# Patient Record
Sex: Female | Born: 1990 | Race: White | Hispanic: No | Marital: Single | Smoking: Current every day smoker
Health system: Southern US, Community
[De-identification: ages and names within clinical notes are randomized; demographics above are authoritative.]

## PROBLEM LIST (undated history)

## (undated) DIAGNOSIS — L409 Psoriasis, unspecified: Secondary | ICD-10-CM

## (undated) DIAGNOSIS — B1920 Unspecified viral hepatitis C without hepatic coma: Secondary | ICD-10-CM

## (undated) DIAGNOSIS — M069 Rheumatoid arthritis, unspecified: Secondary | ICD-10-CM

## (undated) DIAGNOSIS — B977 Papillomavirus as the cause of diseases classified elsewhere: Secondary | ICD-10-CM

## (undated) DIAGNOSIS — J45909 Unspecified asthma, uncomplicated: Secondary | ICD-10-CM

## (undated) DIAGNOSIS — F419 Anxiety disorder, unspecified: Secondary | ICD-10-CM

## (undated) HISTORY — PX: HERNIA REPAIR: SHX51

## (undated) HISTORY — DX: Psoriasis, unspecified: L40.9

---

## 2016-02-01 ENCOUNTER — Encounter (HOSPITAL_COMMUNITY): Payer: Self-pay | Admitting: Emergency Medicine

## 2016-02-01 ENCOUNTER — Emergency Department (HOSPITAL_COMMUNITY)
Admission: EM | Admit: 2016-02-01 | Discharge: 2016-02-01 | Disposition: A | Discharge status: Home / Self Care | Payer: Self-pay | Attending: Emergency Medicine | Admitting: Emergency Medicine

## 2016-02-01 DIAGNOSIS — F17200 Nicotine dependence, unspecified, uncomplicated: Secondary | ICD-10-CM | POA: Insufficient documentation

## 2016-02-01 DIAGNOSIS — J45909 Unspecified asthma, uncomplicated: Secondary | ICD-10-CM | POA: Insufficient documentation

## 2016-02-01 DIAGNOSIS — M79674 Pain in right toe(s): Secondary | ICD-10-CM | POA: Insufficient documentation

## 2016-02-01 HISTORY — DX: Unspecified asthma, uncomplicated: J45.909

## 2016-02-01 HISTORY — DX: Anxiety disorder, unspecified: F41.9

## 2016-02-01 MED ORDER — PREDNISONE 20 MG PO TABS
60.0000 mg | ORAL_TABLET | Freq: Once | ORAL | Status: AC
  Administered 2016-02-01: 60 mg via ORAL
  Filled 2016-02-01: qty 3

## 2016-02-01 MED ORDER — PREDNISONE 50 MG PO TABS: ORAL_TABLET | ORAL | 0 refills | Status: DC

## 2016-02-01 NOTE — Discharge Instructions (Signed)
Rest, Ice intermittently (in the first 24-48 hours), Gentle compression with an Ace wrap, and elevate (Limb above the level of the heart)   Take acetaminophen (Tylenol) up to 975 mg (this is normally 3 over-the-counter pills) up to 3 times a day. Do not drink alcohol. Make sure your other medications do not contain acetaminophen (Read the labels!)

## 2016-02-01 NOTE — ED Notes (Signed)
Declined W/C at D/C and was escorted to lobby by RN. 

## 2016-02-01 NOTE — ED Triage Notes (Signed)
Pt sts 3rd digit toe pain on right foot from insect bite x 2 days

## 2016-02-01 NOTE — ED Provider Notes (Signed)
[facility] Provider Note   CSN: [id number] Arrival date & time: 02/01/16  1714   By signing my name below, I, Clovis Pu, attest that this documentation has been prepared under the direction and in the presence of  [employer], PA-C. Electronically Signed: Clovis Pu, ED Scribe. 02/01/16. 6:02 PM.  History   Chief Complaint Chief Complaint  Patient presents with  . Toe Pain    The history is provided by the patient. No language interpreter was used.   HPI Comments:  Nicole Knight is a 25 y.o. female who presents to the Emergency Department complaining of constant, moderate pain to the 3rd digit of her R foot which began 2 days ago. Pt notes the severity of the pain as being a "9/10" and states the pain is exacerbated with ambulation. She states the cause may have been a insect bite but denies witnessing the incident. She notes associated swelling to the area. She denies any other complaints at this time. Pt has been taking tylenol and benadryl with little relief.   Past Medical History:  Diagnosis Date  . Anxiety   . Asthma     There are no active problems to display for this patient.   History reviewed. No pertinent surgical history.  OB History    No data available       Home Medications    Prior to Admission medications   Medication Sig Start Date End Date Taking? Authorizing Provider  predniSONE (DELTASONE) 50 MG tablet Take 1 tablet daily with breakfast 02/01/16   Nicole Emery, PA-C    Family History History reviewed. No pertinent family history.  Social History Social History  Substance Use Topics  . Smoking status: Current Every Day Smoker  . Smokeless tobacco: Never Used  . Alcohol use No     Allergies   Ibuprofen and Zofran [ondansetron hcl]   Review of Systems Review of Systems 10 systems reviewed and all are negative for acute change except as noted in the HPI.    Physical Exam Updated Vital Signs BP 118/76 (BP  Location: Right Arm)   Pulse 69   Temp 98.5 F (36.9 C) (Oral)   Resp 18   SpO2 100%   Physical Exam  Constitutional: She is oriented to person, place, and time. She appears well-developed and well-nourished. No distress.  HENT:  Head: Normocephalic.  Eyes: Conjunctivae and EOM are normal.  Cardiovascular: Normal rate.   Pulmonary/Chest: Effort normal. No stridor.  Musculoskeletal: Normal range of motion. She exhibits edema and tenderness.  Right second digit with very mild edema and erythema to lateral aspect no puncture wound or warmth. No focal fluctuance.  Neurological: She is alert and oriented to person, place, and time.  Psychiatric: She has a normal mood and affect.  Nursing note and vitals reviewed.    ED Treatments / Results  DIAGNOSTIC STUDIES:  Oxygen Saturation is 100% on RA, normal by my interpretation.    COORDINATION OF CARE:  5:59 PM Discussed treatment plan with pt at bedside and pt agreed to plan.  Labs (all labs ordered are listed, but only abnormal results are displayed) Labs Reviewed - No data to display  EKG  EKG Interpretation None       Radiology No results found.  Procedures Procedures (including critical care time)  Medications Ordered in ED Medications  predniSONE (DELTASONE) tablet 60 mg (60 mg Oral Given 02/01/16 1830)     Initial Impression / Assessment and Plan / ED Course  I have reviewed the triage vital signs and the nursing notes.  Pertinent labs & imaging results that were available during my care of the patient were reviewed by me and considered in my medical decision making (see chart for details).  Clinical Course    Vitals:   02/01/16 1720  BP: 118/76  Pulse: 69  Resp: 18  Temp: 98.5 F (36.9 C)  TempSrc: Oral  SpO2: 100%    Medications  predniSONE (DELTASONE) tablet 60 mg (60 mg Oral Given 02/01/16 1830)    Nicole Knight is 25 y.o. female presenting with Pain and slight swelling to toe, likely  bit by a bug in the middle of the night. No significant warmth, very mild tenderness and edema. No signs of infection.  Evaluation does not show pathology that would require ongoing emergent intervention or inpatient treatment. Pt is hemodynamically stable and mentating appropriately. Discussed findings and plan with patient/guardian, who agrees with care plan. All questions answered. Return precautions discussed and outpatient follow up given.      Final Clinical Impressions(s) / ED Diagnoses   Final diagnoses:  Toe pain, right    New Prescriptions Discharge Medication List as of 02/01/2016  6:14 PM    START taking these medications   Details  predniSONE (DELTASONE) 50 MG tablet Take 1 tablet daily with breakfast, Print       I personally performed the services described in this documentation, which was scribed in my presence. The recorded information has been reviewed and is accurate.    Nicole Emeryicole Patriciann Becht, PA-C 02/01/16 1858    Nicole Reiningicole Tyliyah Mcmeekin, PA-C 02/01/16 1858    Cathren LaineKevin Steinl, MD 02/01/16 [phone]

## 2016-02-09 ENCOUNTER — Encounter (HOSPITAL_COMMUNITY): Payer: Self-pay | Admitting: Emergency Medicine

## 2016-02-09 ENCOUNTER — Emergency Department (HOSPITAL_COMMUNITY): Payer: Self-pay

## 2016-02-09 DIAGNOSIS — Y9389 Activity, other specified: Secondary | ICD-10-CM | POA: Insufficient documentation

## 2016-02-09 DIAGNOSIS — Z87891 Personal history of nicotine dependence: Secondary | ICD-10-CM | POA: Insufficient documentation

## 2016-02-09 DIAGNOSIS — S29011A Strain of muscle and tendon of front wall of thorax, initial encounter: Secondary | ICD-10-CM | POA: Insufficient documentation

## 2016-02-09 DIAGNOSIS — Y990 Civilian activity done for income or pay: Secondary | ICD-10-CM | POA: Insufficient documentation

## 2016-02-09 DIAGNOSIS — J45909 Unspecified asthma, uncomplicated: Secondary | ICD-10-CM | POA: Insufficient documentation

## 2016-02-09 DIAGNOSIS — X509XXA Other and unspecified overexertion or strenuous movements or postures, initial encounter: Secondary | ICD-10-CM | POA: Insufficient documentation

## 2016-02-09 DIAGNOSIS — Y9259 Other trade areas as the place of occurrence of the external cause: Secondary | ICD-10-CM | POA: Insufficient documentation

## 2016-02-09 DIAGNOSIS — S39011A Strain of muscle, fascia and tendon of abdomen, initial encounter: Secondary | ICD-10-CM | POA: Insufficient documentation

## 2016-02-09 LAB — BASIC METABOLIC PANEL
Anion gap: 9 (ref 5–15)
BUN: 17 mg/dL (ref 6–20)
CALCIUM: 9.2 mg/dL (ref 8.9–10.3)
CO2: 24 mmol/L (ref 22–32)
CREATININE: 0.87 mg/dL (ref 0.44–1.00)
Chloride: 108 mmol/L (ref 101–111)
GFR calc Af Amer: >60 mL/min (ref >60)
Glucose, Bld: 101 mg/dL — ABNORMAL HIGH (ref 65–99)
POTASSIUM: 3.2 mmol/L — AB (ref 3.5–5.1)
SODIUM: 141 mmol/L (ref 135–145)

## 2016-02-09 LAB — CBC
HEMATOCRIT: 40.7 % (ref 36.0–46.0)
Hemoglobin: 13.5 g/dL (ref 12.0–15.0)
MCH: 30 pg (ref 26.0–34.0)
MCHC: 33.2 g/dL (ref 30.0–36.0)
MCV: 90.4 fL (ref 78.0–100.0)
PLATELETS: 220 10*3/uL (ref 150–400)
RBC: 4.5 MIL/uL (ref 3.87–5.11)
RDW: 12.5 % (ref 11.5–15.5)
WBC: 9.6 10*3/uL (ref 4.0–10.5)

## 2016-02-09 LAB — I-STAT TROPONIN, ED: TROPONIN I, POC: 0 ng/mL (ref 0.00–0.08)

## 2016-02-09 NOTE — ED Triage Notes (Signed)
Endorses history of hepatitis C.

## 2016-02-09 NOTE — ED Triage Notes (Signed)
Patient arrives with complaint of right lower chest and RUQ abdominal pain. 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 States history of similar pain, but it usually resolves. Tonight the pain has lasted over 1 hours and has not improved. Appears very uncomfortable in triage. Taking deliberate breaths. Endorses exacerbation of pain with deep inspiration. Palpation of right posterior ribs exacerbates pain. History of Asthma.

## 2016-02-10 ENCOUNTER — Emergency Department (HOSPITAL_COMMUNITY)
Admission: EM | Admit: 2016-02-10 | Discharge: 2016-02-10 | Disposition: A | Discharge status: Home / Self Care | Payer: Self-pay | Attending: Emergency Medicine | Admitting: Emergency Medicine

## 2016-02-10 ENCOUNTER — Encounter (HOSPITAL_COMMUNITY): Payer: Self-pay | Admitting: *Deleted

## 2016-02-10 ENCOUNTER — Emergency Department (HOSPITAL_COMMUNITY): Payer: Self-pay

## 2016-02-10 DIAGNOSIS — T148XXA Other injury of unspecified body region, initial encounter: Secondary | ICD-10-CM

## 2016-02-10 HISTORY — DX: Unspecified viral hepatitis C without hepatic coma: B19.20

## 2016-02-10 LAB — HEPATIC FUNCTION PANEL
ALBUMIN: 4.2 g/dL (ref 3.5–5.0)
ALT: 19 U/L (ref 14–54)
AST: 22 U/L (ref 15–41)
Alkaline Phosphatase: 55 U/L (ref 38–126)
Bilirubin, Direct: <0.1 mg/dL — ABNORMAL LOW (ref 0.1–0.5)
TOTAL PROTEIN: 6.8 g/dL (ref 6.5–8.1)
Total Bilirubin: 0.4 mg/dL (ref 0.3–1.2)

## 2016-02-10 LAB — LIPASE, BLOOD: LIPASE: 24 U/L (ref 11–51)

## 2016-02-10 LAB — POC URINE PREG, ED: PREG TEST UR: NEGATIVE

## 2016-02-10 MED ORDER — METHOCARBAMOL 500 MG PO TABS: 500.0000 mg | ORAL_TABLET | Freq: Two times a day (BID) | ORAL | 0 refills | Status: DC

## 2016-02-10 NOTE — ED Notes (Signed)
Patient transported to Ultrasound 

## 2016-02-10 NOTE — ED Provider Notes (Signed)
[facility] Provider Note   CSN: [id number] Arrival date & time: 02/09/16  2019     History   Chief Complaint Chief Complaint  Patient presents with  . Shortness of Breath  . Chest Pain    HPI Nicole Knight is a 25 y.o. female.  Patient presents to the emergency department for evaluation of right-sided abdominal and chest pain. Patient reports that symptoms began tonight at work while Econom[employer]. Patient reports that she has had similar pain in the past, but usually it goes away fairly quickly. Tonight the pain has been persistent and more severe. Patient reports the pain is in the right upper abdomen but also the right side of the chest. She is feeling somewhat short of breath secondary to the pain. Pain worsens when she takes a deep breath.      Past Medical History:  Diagnosis Date  . Anxiety   . Asthma   . Hepatitis C     There are no active problems to display for this patient.   Past Surgical History:  Procedure Laterality Date  . HERNIA REPAIR      OB History    No data available       Home Medications    Prior to Admission medications   Medication Sig Start Date End Date Taking? Authorizing Provider  Adalimumab (HUMIRA) 40 MG/0.8ML PSKT Inject 40 mg into the skin every 21 ( twenty-one) days.   Yes Historical Provider, MD  albuterol (PROVENTIL HFA;VENTOLIN HFA) 108 (90 Base) MCG/ACT inhaler Inhale 1-2 puffs into the lungs every 6 (six) hours as needed for wheezing or shortness of breath.   Yes Historical Provider, MD  HYDROcodone-acetaminophen (NORCO) 10-325 MG tablet Take 1 tablet by mouth every 6 (six) hours as needed for moderate pain.   Yes Historical Provider, MD  medroxyPROGESTERone (DEPO-PROVERA) 150 MG/ML injection Inject 150 mg into the muscle every 3 (three) months.   Yes Historical Provider, MD  methocarbamol (ROBAXIN) 500 MG tablet Take 1 tablet (500 mg total) by mouth 2 (two) times daily. 02/10/16   Gilda Crease, MD      Family History History reviewed. No pertinent family history.  Social History Social History  Substance Use Topics  . Smoking status: Former Smoker    Quit date: 01/26/2016  . Smokeless tobacco: Never Used  . Alcohol use No     Allergies   Ibuprofen and Zofran [ondansetron hcl]   Review of Systems Review of Systems  Respiratory: Positive for shortness of breath.   Cardiovascular: Positive for chest pain.  Gastrointestinal: Positive for abdominal pain.  All other systems reviewed and are negative.    Physical Exam Updated Vital Signs BP 100/71   Pulse 86   Temp 98.2 F (36.8 C)   Resp 20   Ht 5' 2.25" (1.581 m)   Wt 134 lb 4 oz (60.9 kg)   SpO2 100%   BMI 24.36 kg/m   Physical Exam  Constitutional: She is oriented to person, place, and time. She appears well-developed and well-nourished. No distress.  HENT:  Head: Normocephalic and atraumatic.  Right Ear: Hearing normal.  Left Ear: Hearing normal.  Nose: Nose normal.  Mouth/Throat: Oropharynx is clear and moist and mucous membranes are normal.  Eyes: Conjunctivae and EOM are normal. Pupils are equal, round, and reactive to light.  Neck: Normal range of motion. Neck supple.  Cardiovascular: Regular rhythm, S1 normal and S2 normal.  Exam reveals no gallop and no friction rub.  No murmur heard. Pulmonary/Chest: Effort normal and breath sounds normal. No respiratory distress. She exhibits no tenderness.  Abdominal: Soft. Normal appearance and bowel sounds are normal. There is no hepatosplenomegaly. There is tenderness in the right upper quadrant. There is no rebound, no guarding, no tenderness at McBurney's point and negative Murphy's sign. No hernia.  Musculoskeletal: Normal range of motion.  Neurological: She is alert and oriented to person, place, and time. She has normal strength. No cranial nerve deficit or sensory deficit. Coordination normal. GCS eye subscore is 4. GCS verbal subscore is 5. GCS motor  subscore is 6.  Skin: Skin is warm, dry and intact. No rash noted. No cyanosis.  Psychiatric: She has a normal mood and affect. Her speech is normal and behavior is normal. Thought content normal.  Nursing note and vitals reviewed.    ED Treatments / Results  Labs (all labs ordered are listed, but only abnormal results are displayed) Labs Reviewed  BASIC METABOLIC PANEL - Abnormal; Notable for the following:       Result Value   Potassium 3.2 (*)    Glucose, Bld 101 (*)    All other components within normal limits  HEPATIC FUNCTION PANEL - Abnormal; Notable for the following:    Bilirubin, Direct <0.1 (*)    All other components within normal limits  CBC  LIPASE, BLOOD  I-STAT TROPOININ, ED  POC URINE PREG, ED    EKG  EKG Interpretation  Date/Time:  Monday February 09 2016 20:29:14 EDT Ventricular Rate:  86 PR Interval:  140 QRS Duration: 80 QT Interval:  374 QTC Calculation: 447 R Axis:   90 Text Interpretation:  Normal sinus rhythm Rightward axis Borderline ECG Confirmed by Blinda Leatherwood  MD, Zafiro Routson [phone]) on 02/10/2016 4:51:47 AM       Radiology Dg Chest 2 View  Result Date: 02/09/2016 CLINICAL DATA:  Right lower chest pain and shortness of breath today. EXAM: CHEST  2 VIEW COMPARISON:  None. FINDINGS: The cardiac silhouette, mediastinal and hilar contours are within normal limits given the pectus deformity. Prominent right infrahilar vessels is a typical finding. The lungs are clear. No pleural effusion. The bony thorax is intact. IMPRESSION: No acute cardiopulmonary findings. Moderate pectus deformity. Electronically Signed   By: Rudie Meyer M.D.   On: 02/09/2016 21:35   US Abdomen Limited Ruq  Result Date: 02/10/2016 CLINICAL DATA:  Right upper quadrant pain beginning earlier tonight after moving heavy luggage. History of hepatitis-C. EXAM: US ABDOMEN LIMITED - RIGHT UPPER QUADRANT COMPARISON:  None. FINDINGS: Gallbladder: No gallstones or wall thickening  visualized. No sonographic Murphy sign noted by sonographer. Common bile duct: Diameter: 2.8 mm, normal Liver: No focal lesion identified. Within normal limits in parenchymal echogenicity. IMPRESSION: Normal examination. Electronically Signed   By: Burman Nieves M.D.   On: 02/10/2016 05:25    Procedures Procedures (including critical care time)  Medications Ordered in ED Medications - No data to display   Initial Impression / Assessment and Plan / ED Course  I have reviewed the triage vital signs and the nursing notes.  Pertinent labs & imaging results that were available during my care of the patient were reviewed by me and considered in my medical decision making (see chart for details).  Clinical Course   Patient presents with complaints of pain on the right side of her abdomen and chest area. This occurred while pushing a heavy object. She has had this pain in the past as well, however. Patient had tenderness  in the right upper quadrant of her abdomen, as opposed to tenderness over the anterior rib field. X-ray of chest was unremarkable. Blood work was normal. Because of the abdominal tenderness, gallbladder disease was considered a possibility and ultrasound was performed. Ultrasound was normal. Symptoms likely explained by abdominal muscle strain.  Final Clinical Impressions(s) / ED Diagnoses   Final diagnoses:  Muscle strain    New Prescriptions New Prescriptions   METHOCARBAMOL (ROBAXIN) 500 MG TABLET    Take 1 tablet (500 mg total) by mouth 2 (two) times daily.     Gilda Creasehristopher J Cecilia Nishikawa, MD 02/10/16 [phone]

## 2016-02-19 ENCOUNTER — Telehealth: Payer: Self-pay | Admitting: *Deleted

## 2016-02-19 ENCOUNTER — Emergency Department (HOSPITAL_COMMUNITY): Payer: Self-pay

## 2016-02-19 ENCOUNTER — Encounter (HOSPITAL_COMMUNITY): Payer: Self-pay | Admitting: Emergency Medicine

## 2016-02-19 ENCOUNTER — Emergency Department (HOSPITAL_COMMUNITY)
Admission: EM | Admit: 2016-02-19 | Discharge: 2016-02-19 | Disposition: A | Discharge status: Home / Self Care | Payer: Self-pay | Attending: Emergency Medicine | Admitting: Emergency Medicine

## 2016-02-19 DIAGNOSIS — J069 Acute upper respiratory infection, unspecified: Secondary | ICD-10-CM | POA: Insufficient documentation

## 2016-02-19 DIAGNOSIS — J45909 Unspecified asthma, uncomplicated: Secondary | ICD-10-CM | POA: Insufficient documentation

## 2016-02-19 DIAGNOSIS — Z87891 Personal history of nicotine dependence: Secondary | ICD-10-CM | POA: Insufficient documentation

## 2016-02-19 HISTORY — DX: Rheumatoid arthritis, unspecified: M06.9

## 2016-02-19 MED ORDER — IPRATROPIUM-ALBUTEROL 0.5-2.5 (3) MG/3ML IN SOLN
3.0000 mL | Freq: Once | RESPIRATORY_TRACT | Status: AC
  Administered 2016-02-19: 3 mL via RESPIRATORY_TRACT
  Filled 2016-02-19: qty 3

## 2016-02-19 MED ORDER — AZITHROMYCIN 250 MG PO TABS: ORAL_TABLET | ORAL | 0 refills | Status: DC

## 2016-02-19 MED ORDER — HYDROCODONE-ACETAMINOPHEN 5-325 MG PO TABS: ORAL_TABLET | ORAL | 0 refills | Status: DC

## 2016-02-19 MED ORDER — ALBUTEROL SULFATE HFA 108 (90 BASE) MCG/ACT IN AERS
2.0000 | INHALATION_SPRAY | Freq: Once | RESPIRATORY_TRACT | Status: AC
  Administered 2016-02-19: 2 via RESPIRATORY_TRACT
  Filled 2016-02-19: qty 6.7

## 2016-02-19 NOTE — ED Provider Notes (Signed)
[facility] Provider Note   CSN: [id number] Arrival date & time: 02/19/16  4098  By signing my name below, I, Nicole Knight, attest that this documentation has been prepared under the direction and in the presence of non-physician practitioner, Wynetta Emery, PA-C. Electronically Signed: Freida Knight, Scribe. 02/19/2016. 5:15 PM.   History   Chief Complaint Chief Complaint  Patient presents with  . URI  . Cough  . Sore Throat    The history is provided by the patient. No language interpreter was used.    HPI Comments:  Nicole Knight is a 25 y.o. female with a history of asthmaAnd rheumatoid arthritis, who presents to the Emergency Department complaining of a productive cough with green sputum x ~  1 week. Pt reports associated mild SOB; notes she is out of her inhaler. She also notes rhinorrhea, congestion, and bilateral ear pain (L>R). She has taken Mucinex with mild relief.  She denies recent long periods of immobilizations. Pt is compliant with her Humira and has received her flu shot this season.   No PCP  Past Medical History:  Diagnosis Date  . Anxiety   . Asthma   . Hepatitis C   . RA (rheumatoid arthritis) (HCC)     There are no active problems to display for this patient.   Past Surgical History:  Procedure Laterality Date  . HERNIA REPAIR      OB History    No data available       Home Medications    Prior to Admission medications   Medication Sig Start Date End Date Taking? Authorizing Provider  Adalimumab (HUMIRA) 40 MG/0.8ML PSKT Inject 40 mg into the skin every 21 ( twenty-one) days.    Historical Provider, MD  albuterol (PROVENTIL HFA;VENTOLIN HFA) 108 (90 Base) MCG/ACT inhaler Inhale 1-2 puffs into the lungs every 6 (six) hours as needed for wheezing or shortness of breath.    Historical Provider, MD  azithromycin (ZITHROMAX Z-PAK) 250 MG tablet 2 po day one, then 1 daily x 4 days 02/19/16   Joni Reining Hajime Asfaw, PA-C    HYDROcodone-acetaminophen (NORCO/VICODIN) 5-325 MG tablet Take 1-2 tablets by mouth every 6 hours as needed for pain. 02/19/16   Seaver Machia, PA-C  medroxyPROGESTERone (DEPO-PROVERA) 150 MG/ML injection Inject 150 mg into the muscle every 3 (three) months.    Historical Provider, MD  methocarbamol (ROBAXIN) 500 MG tablet Take 1 tablet (500 mg total) by mouth 2 (two) times daily. 02/10/16   Gilda Crease, MD    Family History No family history on file.  Social History Social History  Substance Use Topics  . Smoking status: Former Smoker    Quit date: 01/26/2016  . Smokeless tobacco: Never Used  . Alcohol use No     Allergies   Ibuprofen and Zofran [ondansetron hcl]   Review of Systems Review of Systems  10 systems reviewed and all are negative for acute change except as noted in the HPI.  Physical Exam Updated Vital Signs BP (!) 114/54 (BP Location: Left Arm)   Pulse 62   Temp 98.2 F (36.8 C) (Oral)   Resp 16   SpO2 100%   Physical Exam  Constitutional: She appears well-developed and well-nourished.  HENT:  Head: Normocephalic.  Right Ear: External ear normal.  Left Ear: External ear normal.  Mouth/Throat: Oropharynx is clear and moist. No oropharyngeal exudate.  No drooling or stridor. Posterior pharynx mildly erythematous no significant tonsillar hypertrophy. No exudate. Soft palate rises symmetrically. No  TTP or induration under tongue.   No tenderness to palpation of frontal or bilateral maxillary sinuses.  Mild mucosal edema in the nares with scant rhinorrhea.  Bilateral tympanic membranes with normal architecture and good light reflex.    Eyes: Conjunctivae and EOM are normal. Pupils are equal, round, and reactive to light.  Neck: Normal range of motion. Neck supple.  Cardiovascular: Normal rate and regular rhythm.   Pulmonary/Chest: Effort normal and breath sounds normal. No stridor. No respiratory distress. She has no wheezes. She has no  rales. She exhibits no tenderness.  Abdominal: Soft. There is no tenderness. There is no rebound and no guarding.  Nursing note and vitals reviewed.    ED Treatments / Results  DIAGNOSTIC STUDIES:  Oxygen Saturation is 100% on RA, normal by my interpretation.    COORDINATION OF CARE:  10:11 AM Discussed treatment plan with pt at bedside and pt agreed to plan.  Labs (all labs ordered are listed, but only abnormal results are displayed) Labs Reviewed - No data to display  EKG  EKG Interpretation None       Radiology Dg Chest 2 View  Result Date: 02/19/2016 CLINICAL DATA:  Shortness of breath and cough for 1 week EXAM: CHEST  2 VIEW COMPARISON:  February 09, 2016 FINDINGS: There is a degree of pectus excavatum, stable. There is no apparent edema or consolidation. Heart size and pulmonary vascularity is within normal limits and stable. No adenopathy. No pneumothorax. IMPRESSION: No edema or consolidation. Stable pectus deformity. No appreciable change from recent prior study. Electronically Signed   By: Bretta Bang III M.D.   On: 02/19/2016 10:34    Procedures Procedures (including critical care time)  Medications Ordered in ED Medications  ipratropium-albuterol (DUONEB) 0.5-2.5 (3) MG/3ML nebulizer solution 3 mL (3 mLs Nebulization Given 02/19/16 1033)  albuterol (PROVENTIL HFA;VENTOLIN HFA) 108 (90 Base) MCG/ACT inhaler 2 puff (2 puffs Inhalation Given 02/19/16 1047)     Initial Impression / Assessment and Plan / ED Course  I have reviewed the triage vital signs and the nursing notes.  Pertinent labs & imaging results that were available during my care of the patient were reviewed by me and considered in my medical decision making (see chart for details).  Clinical Course    Vitals:   02/19/16 0947 02/19/16 1053  BP: 109/70 (!) 114/54  Pulse: 65 62  Resp:  16  Temp: 98.2 F (36.8 C)   TempSrc: Oral   SpO2: 100% 100%    Medications   ipratropium-albuterol (DUONEB) 0.5-2.5 (3) MG/3ML nebulizer solution 3 mL (3 mLs Nebulization Given 02/19/16 1033)  albuterol (PROVENTIL HFA;VENTOLIN HFA) 108 (90 Base) MCG/ACT inhaler 2 puff (2 puffs Inhalation Given 02/19/16 1047)    Nicole Knight is 25 y.o. female presenting with Reactive cough and shortness of breath. Lung sounds clear to auscultation, she saturating well on room air. Febrile and nontoxic-appearing. DuoNeb was given with improvement, states she's out of her home albuterol inhaler. Will start her on a Z-Pak for upper respiratory infection given her immunosuppression with Humira.  Evaluation does not show pathology that would require ongoing emergent intervention or inpatient treatment. Pt is hemodynamically stable and mentating appropriately. Discussed findings and plan with patient/guardian, who agrees with care plan. All questions answered. Return precautions discussed and outpatient follow up given.      Final Clinical Impressions(s) / ED Diagnoses   Final diagnoses:  URI (upper respiratory infection)    New Prescriptions Discharge Medication List as of  02/19/2016 10:55 AM    START taking these medications   Details  azithromycin (ZITHROMAX Z-PAK) 250 MG tablet 2 po day one, then 1 daily x 4 days, Print    HYDROcodone-acetaminophen (NORCO/VICODIN) 5-325 MG tablet Take 1-2 tablets by mouth every 6 hours as needed for pain., Print       I personally performed the services described in this documentation, which was scribed in my presence. The recorded information has been reviewed and is accurate.     Wynetta Emeryicole Laney Bagshaw, PA-C 02/19/16 1716    Lyndal Pulleyaniel Knott, MD 02/20/16 [id number]

## 2016-02-19 NOTE — Telephone Encounter (Signed)
Pharmacy called related to Rx: azithromycin (ZITHROMAX Z-PAK) 250 MG tablet .Marland Kitchen.Marland Kitchen.EDCM clarified with EDP to change Rx to: #6.

## 2016-02-19 NOTE — ED Notes (Signed)
Returned from xray

## 2016-02-19 NOTE — Discharge Instructions (Signed)

## 2016-02-26 ENCOUNTER — Encounter (HOSPITAL_COMMUNITY): Payer: Self-pay

## 2016-02-26 ENCOUNTER — Ambulatory Visit (HOSPITAL_COMMUNITY)
Admit: 2016-02-26 | Discharge: 2016-02-26 | Disposition: A | Discharge status: Home / Self Care | Payer: Self-pay | Attending: Obstetrics and Gynecology | Admitting: Obstetrics and Gynecology

## 2016-02-26 VITALS — BP 104/60 | Temp 98.4°F | Ht 62.5 in | Wt 133.8 lb

## 2016-02-26 DIAGNOSIS — Z1239 Encounter for other screening for malignant neoplasm of breast: Secondary | ICD-10-CM

## 2016-02-26 DIAGNOSIS — R87612 Low grade squamous intraepithelial lesion on cytologic smear of cervix (LGSIL): Secondary | ICD-10-CM

## 2016-02-26 NOTE — Patient Instructions (Signed)
Explained breast self awareness with Nicole Knight Nicole Knight. Explained the colposcopy the needed follow up for her abnormal Pap smear on 01/07/2016. [facility]. Appointment scheduled for Monday, March 08, 2016 at 0820. Patient aware of appointment and will be there. Informed patient she will need a screening mammogram at age [age] unless clinically indicated prior. Nicole Knight verbalized understanding.  Nicole Knight, Kathaleen Maserhristine Poll, RN 4:11 PM

## 2016-02-26 NOTE — Progress Notes (Signed)
[facility] due to having an abnormal Pap smear on 01/07/2016 that a colposcopy is recommended for follow up.  Pap Smear: Pap smear not completed today. [facility] with HPV effect present. [facility] for col[facility]. Appointment scheduled for Monday, March 08, 2016 at 0820. Patient has a history of two other abnormal Pap smears 11/28/2012 that was ASCUS with positive HPV and 01/07/2010 that was ASCUS. Repeat Pap smears were completed for follow up for the above two abnormal Pap smears. Last Pap smear result is in EPIC.  Physical exam: Breasts Breasts symmetrical. No skin abnormalities bilateral breasts. No nipple retraction bilateral breasts. No nipple discharge bilateral breasts. No lymphadenopathy. No lumps palpated bilateral breasts. No complaints of pain or tenderness on exam. Screening mammogram recommended at age 25 unless clinically indicated prior.   Pelvic/Bimanual No Pap smear completed today since last Pap smear was 01/07/2016. Pap smear not indicated per [facility]CP guidelines.   Smoking History: Patient has never smoked.  Patient Navigation: Patient education provided. [facility] to services [facility].

## 2016-03-01 ENCOUNTER — Encounter (HOSPITAL_COMMUNITY): Payer: Self-pay | Admitting: *Deleted

## 2016-03-08 ENCOUNTER — Other Ambulatory Visit (HOSPITAL_COMMUNITY)
Admission: RE | Admit: 2016-03-08 | Discharge: 2016-03-08 | Disposition: A | Discharge status: Home / Self Care | Payer: Self-pay | Source: Ambulatory Visit | Attending: Obstetrics & Gynecology | Admitting: Obstetrics & Gynecology

## 2016-03-08 ENCOUNTER — Encounter: Payer: Self-pay | Admitting: Family Medicine

## 2016-03-08 ENCOUNTER — Encounter: Payer: Self-pay | Admitting: Obstetrics & Gynecology

## 2016-03-08 ENCOUNTER — Ambulatory Visit (INDEPENDENT_AMBULATORY_CARE_PROVIDER_SITE_OTHER): Payer: Self-pay | Admitting: Obstetrics & Gynecology

## 2016-03-08 VITALS — BP 114/66 | HR 70 | Wt 133.1 lb

## 2016-03-08 DIAGNOSIS — Z113 Encounter for screening for infections with a predominantly sexual mode of transmission: Secondary | ICD-10-CM | POA: Insufficient documentation

## 2016-03-08 DIAGNOSIS — L409 Psoriasis, unspecified: Secondary | ICD-10-CM | POA: Insufficient documentation

## 2016-03-08 DIAGNOSIS — N898 Other specified noninflammatory disorders of vagina: Secondary | ICD-10-CM

## 2016-03-08 DIAGNOSIS — Z3202 Encounter for pregnancy test, result negative: Secondary | ICD-10-CM

## 2016-03-08 DIAGNOSIS — R87612 Low grade squamous intraepithelial lesion on cytologic smear of cervix (LGSIL): Secondary | ICD-10-CM

## 2016-03-08 LAB — POCT PREGNANCY, URINE: Preg Test, Ur: NEGATIVE

## 2016-03-08 NOTE — Addendum Note (Signed)
Addended by: Faythe CasaBELLAMY, Tremeka Helbling M on: 03/08/2016 11:35 AM   Modules accepted: Orders

## 2016-03-08 NOTE — Progress Notes (Signed)
Colposcopy Procedure Note  Indications: Pap smear 2 months ago showed: low-grade squamous intraepithelial neoplasia (LGSIL - encompassing HPV,mild dysplasia,CIN I). The prior pap showed ASCUS with POSITIVE high risk HPV.  Prior cervical/vaginal disease: normal exam without visible pathology. Prior cervical treatment: no treatment.  Procedure Details  The risks and benefits of the procedure and Written informed consent obtained.  Speculum placed in vagina and excellent visualization of cervix achieved, cervix swabbed x 3 with acetic acid solution.  Findings: Cervix: acetowhite lesion(s) noted at 11-2 o'clock; 7 & 9 o'clock cervix swabbed with Lugol's solution, SCJ visualized x 360 degrees, cervical biopsies taken at 12 & 9 o'clock, specimen labelled and sent to pathology and hemostasis achieved with Monsel's solution. Vaginal inspection: vaginal colposcopy not performed. Vulvar colposcopy: vulvar colposcopy not performed.  Specimens: ECC, and Biopsies at 12 & 9  Complications: none.  Plan: Specimens labelled and sent to Pathology. Will base further treatment on Pathology findings.

## 2016-03-08 NOTE — Patient Instructions (Signed)

## 2016-03-09 LAB — GC/CHLAMYDIA PROBE AMP (~~LOC~~) NOT AT ARMC
Chlamydia: NEGATIVE
Neisseria Gonorrhea: NEGATIVE

## 2016-03-09 LAB — WET PREP, GENITAL
Trich, Wet Prep: NONE SEEN
Yeast Wet Prep HPF POC: NONE SEEN

## 2016-03-10 ENCOUNTER — Encounter: Payer: Self-pay | Admitting: General Practice

## 2016-03-10 ENCOUNTER — Other Ambulatory Visit: Payer: Self-pay | Admitting: Obstetrics & Gynecology

## 2016-03-10 MED ORDER — METRONIDAZOLE 500 MG PO TABS: 500.0000 mg | ORAL_TABLET | Freq: Two times a day (BID) | ORAL | 0 refills | Status: DC

## 2016-03-11 MED ORDER — METRONIDAZOLE 500 MG PO TABS: 500.0000 mg | ORAL_TABLET | Freq: Two times a day (BID) | ORAL | 0 refills | Status: DC

## 2016-03-31 ENCOUNTER — Telehealth: Payer: Self-pay

## 2016-03-31 NOTE — Telephone Encounter (Signed)
Per Dr. Penne LashLeggett, pt biopsy CIN 1, no tx at this time, and pap in one year.  Notified pt of results.  Pt stated understanding with no further questions.

## 2016-04-15 ENCOUNTER — Emergency Department (HOSPITAL_COMMUNITY)
Admission: EM | Admit: 2016-04-15 | Discharge: 2016-04-15 | Disposition: A | Discharge status: Home / Self Care | Payer: Self-pay | Attending: Emergency Medicine | Admitting: Emergency Medicine

## 2016-04-15 ENCOUNTER — Emergency Department (HOSPITAL_COMMUNITY): Payer: Self-pay

## 2016-04-15 ENCOUNTER — Encounter (HOSPITAL_COMMUNITY): Payer: Self-pay | Admitting: Emergency Medicine

## 2016-04-15 DIAGNOSIS — R102 Pelvic and perineal pain: Secondary | ICD-10-CM

## 2016-04-15 DIAGNOSIS — F17210 Nicotine dependence, cigarettes, uncomplicated: Secondary | ICD-10-CM | POA: Insufficient documentation

## 2016-04-15 DIAGNOSIS — J45909 Unspecified asthma, uncomplicated: Secondary | ICD-10-CM | POA: Insufficient documentation

## 2016-04-15 DIAGNOSIS — N739 Female pelvic inflammatory disease, unspecified: Secondary | ICD-10-CM | POA: Insufficient documentation

## 2016-04-15 DIAGNOSIS — N730 Acute parametritis and pelvic cellulitis: Secondary | ICD-10-CM

## 2016-04-15 HISTORY — DX: Papillomavirus as the cause of diseases classified elsewhere: B97.7

## 2016-04-15 LAB — CBC
HCT: 41.2 % (ref 36.0–46.0)
Hemoglobin: 14.4 g/dL (ref 12.0–15.0)
MCH: 30.8 pg (ref 26.0–34.0)
MCHC: 35 g/dL (ref 30.0–36.0)
MCV: 88 fL (ref 78.0–100.0)
Platelets: 223 K/uL (ref 150–400)
RBC: 4.68 MIL/uL (ref 3.87–5.11)
RDW: 12.8 % (ref 11.5–15.5)
WBC: 10.8 K/uL — ABNORMAL HIGH (ref 4.0–10.5)

## 2016-04-15 LAB — WET PREP, GENITAL
Sperm: NONE SEEN
Trich, Wet Prep: NONE SEEN
Yeast Wet Prep HPF POC: NONE SEEN

## 2016-04-15 LAB — COMPREHENSIVE METABOLIC PANEL
ALBUMIN: 4.2 g/dL (ref 3.5–5.0)
ALT: 15 U/L (ref 14–54)
ANION GAP: 7 (ref 5–15)
AST: 19 U/L (ref 15–41)
Alkaline Phosphatase: 48 U/L (ref 38–126)
BILIRUBIN TOTAL: 0.5 mg/dL (ref 0.3–1.2)
BUN: 14 mg/dL (ref 6–20)
CO2: 24 mmol/L (ref 22–32)
Calcium: 9.3 mg/dL (ref 8.9–10.3)
Chloride: 106 mmol/L (ref 101–111)
Creatinine, Ser: 0.75 mg/dL (ref 0.44–1.00)
GFR calc Af Amer: >60 mL/min (ref >60)
Glucose, Bld: 96 mg/dL (ref 65–99)
POTASSIUM: 3.9 mmol/L (ref 3.5–5.1)
Sodium: 137 mmol/L (ref 135–145)
TOTAL PROTEIN: 6.6 g/dL (ref 6.5–8.1)

## 2016-04-15 LAB — URINALYSIS, ROUTINE W REFLEX MICROSCOPIC
Bilirubin Urine: NEGATIVE
Glucose, UA: NEGATIVE mg/dL
Hgb urine dipstick: NEGATIVE
Ketones, ur: NEGATIVE mg/dL
Leukocytes, UA: NEGATIVE
Nitrite: NEGATIVE
Protein, ur: NEGATIVE mg/dL
Specific Gravity, Urine: 1.021 (ref 1.005–1.030)
pH: 6 (ref 5.0–8.0)

## 2016-04-15 LAB — POC URINE PREG, ED: Preg Test, Ur: NEGATIVE

## 2016-04-15 LAB — LIPASE, BLOOD: Lipase: 29 U/L (ref 11–51)

## 2016-04-15 MED ORDER — AZITHROMYCIN 250 MG PO TABS
500.0000 mg | ORAL_TABLET | Freq: Once | ORAL | Status: AC
  Administered 2016-04-15: 500 mg via ORAL
  Filled 2016-04-15: qty 2

## 2016-04-15 MED ORDER — PROMETHAZINE HCL 25 MG/ML IJ SOLN
12.5000 mg | Freq: Once | INTRAMUSCULAR | Status: AC
  Administered 2016-04-15: 12.5 mg via INTRAVENOUS
  Filled 2016-04-15: qty 1

## 2016-04-15 MED ORDER — LIDOCAINE HCL (PF) 1 % IJ SOLN
INTRAMUSCULAR | Status: AC
  Administered 2016-04-15: 2 mL via INTRADERMAL
  Filled 2016-04-15: qty 5

## 2016-04-15 MED ORDER — SODIUM CHLORIDE 0.9 % IV BOLUS (SEPSIS)
1000.0000 mL | Freq: Once | INTRAVENOUS | Status: AC
  Administered 2016-04-15: 1000 mL via INTRAVENOUS

## 2016-04-15 MED ORDER — DOXYCYCLINE HYCLATE 100 MG PO CAPS: 100.0000 mg | ORAL_CAPSULE | Freq: Two times a day (BID) | ORAL | 0 refills | Status: AC

## 2016-04-15 MED ORDER — CEFTRIAXONE SODIUM 250 MG IJ SOLR
250.0000 mg | Freq: Once | INTRAMUSCULAR | Status: AC
  Administered 2016-04-15: 250 mg via INTRAMUSCULAR
  Filled 2016-04-15: qty 250

## 2016-04-15 MED ORDER — METRONIDAZOLE 500 MG PO TABS: 500.0000 mg | ORAL_TABLET | Freq: Three times a day (TID) | ORAL | 0 refills | Status: AC

## 2016-04-15 MED ORDER — HYDROCODONE-ACETAMINOPHEN 5-325 MG PO TABS
1.0000 | ORAL_TABLET | Freq: Once | ORAL | Status: AC
Start: 2016-04-15 — End: 2016-04-15
  Administered 2016-04-15: 1 via ORAL
  Filled 2016-04-15: qty 1

## 2016-04-15 MED ORDER — IOPAMIDOL (ISOVUE-300) INJECTION 61%
INTRAVENOUS | Status: AC
  Administered 2016-04-15: 100 mL
  Filled 2016-04-15: qty 100

## 2016-04-15 MED ORDER — FENTANYL CITRATE (PF) 100 MCG/2ML IJ SOLN
50.0000 ug | Freq: Once | INTRAMUSCULAR | Status: AC
  Administered 2016-04-15: 50 ug via INTRAVENOUS
  Filled 2016-04-15: qty 2

## 2016-04-15 MED ORDER — LIDOCAINE HCL (PF) 1 % IJ SOLN
2.0000 mL | Freq: Once | INTRAMUSCULAR | Status: AC
  Administered 2016-04-15: 2 mL via INTRADERMAL

## 2016-04-15 NOTE — ED Notes (Signed)
Pt had a cervical scrape three weeks ago. Pt has been reporting 10/10 pain for the last week with N/V with one occurrence of emesis today. Pt is afraid she has an infection.

## 2016-04-15 NOTE — ED Provider Notes (Signed)
[facility]EMER[facility]Y DEPT Provider Note   CSN: [id number] Arrival date & time: 04/15/16  0023   By signing my name below, I, Clovis PuAvnee Patel, attest that this documentation has been prepared under the direction and in the presence of Glynn OctaveStephen Hilmer Aliberti, MD  Electronically Signed: Clovis PuAvnee Patel, ED Scribe. 04/15/16. 2:35 AM.   History   Chief Complaint Chief Complaint  Patient presents with  . Abdominal Pain   The history is provided by the patient. No language interpreter was used.   HPI Comments:  Nicole Knight is a 25 y.o. female, with a hx of untreated hepatitis C, HPV and psoriasis treated with humera, who presents to the Emergency Department complaining of worsening, constant, moderate left and right abdominal pain near her pelvic region x 1 week. Her pain is worse upon palpation and when lifting items. She notes associated vaginal discharge. Pt has taken tylenol with little relief. Pt denies any hx of abdominal surgery, a hx of endometriosis, hx of ovarian cysts, any recent injury, change in appetite, vomiting, diarrhea, dysuria, hematuria, back pain, chest pain any other associated symptoms and modifying factors at this time. Pt is on birth control (depo shots) and denies the chance of being pregnant. She states she still has her ovaries, uterus, appendix and gall bladder. Pt does not have a PCP and note she goes to the health department. She notes a hx of IV drug use. She is allergic to iburpofen and Zofran.   Past Medical History:  Diagnosis Date  . Anxiety   . Asthma   . Hepatitis C   . HPV in female   . Psoriasis   . RA (rheumatoid arthritis) [facility](HCC)     Patient Active Problem List   Diagnosis Date Noted  . Psoriasis 03/08/2016  . Low grade squamous intraepithelial lesion (LGSIL) on cervical Pap smear 03/08/2016    Past Surgical History:  Procedure Laterality Date  . HERNIA REPAIR      OB History    Gravida Para Term Preterm AB Living   5       4 1    SAB TAB Ectopic  Multiple Live Births   4       1       Home Medications    Prior to Admission medications   Medication Sig Start Date End Date Taking? Authorizing Provider  Adalimumab (HUMIRA) 40 MG/0.8ML PSKT Inject 40 mg into the skin every 21 ( twenty-one) days.    Historical Provider, MD  albuterol (PROVENTIL HFA;VENTOLIN HFA) 108 (90 Base) MCG/ACT inhaler Inhale 1-2 puffs into the lungs every 6 (six) hours as needed for wheezing or shortness of breath.    Historical Provider, MD  azithromycin (ZITHROMAX Z-PAK) 250 MG tablet 2 po day one, then 1 daily x 4 days Patient not taking: Reported on 02/26/2016 02/19/16   Joni ReiningNicole Pisciotta, PA-C  HYDROcodone-acetaminophen (NORCO/VICODIN) 5-325 MG tablet Take 1-2 tablets by mouth every 6 hours as needed for pain. Patient not taking: Reported on 02/26/2016 02/19/16   Joni ReiningNicole Pisciotta, PA-C  medroxyPROGESTERone (DEPO-PROVERA) 150 MG/ML injection Inject 150 mg into the muscle every 3 (three) months.    Historical Provider, MD  methocarbamol (ROBAXIN) 500 MG tablet Take 1 tablet (500 mg total) by mouth 2 (two) times daily. Patient not taking: Reported on 02/26/2016 02/10/16   Gilda Creasehristopher J Pollina, MD  metroNIDAZOLE (FLAGYL) 500 MG tablet Take 1 tablet (500 mg total) by mouth 2 (two) times daily with a meal. 03/11/16   Lesly DukesKelly H Leggett, MD  Family History Family History  Problem Relation Age of Onset  . Hypertension Mother   . Hypertension Father   . Diabetes Paternal Grandfather     Social History Social History  Substance Use Topics  . Smoking status: Current Every Day Smoker    Packs/day: 1.00    Years: 10.00    Types: Cigarettes  . Smokeless tobacco: Never Used  . Alcohol use No     Allergies   Ibuprofen and Zofran [ondansetron hcl]   Review of Systems Review of Systems  Constitutional: Negative for appetite change.  Cardiovascular: Negative for chest pain.  Gastrointestinal: Positive for abdominal pain and nausea. Negative for diarrhea and  vomiting.  Genitourinary: Positive for vaginal discharge. Negative for dysuria and hematuria.  Musculoskeletal: Negative for back pain.   Physical Exam Updated Vital Signs BP 118/79 (BP Location: Left Arm)   Pulse 79   Temp 97.4 F (36.3 C) (Oral)   Resp 24   Ht 5\' 3"  (1.6 m)   Wt 131 lb 6.4 oz (59.6 kg)   SpO2 100%   BMI 23.28 kg/m   Physical Exam  Constitutional: She is oriented to person, place, and time. She appears well-developed and well-nourished. No distress.  Appears uncomfortable   HENT:  Head: Normocephalic and atraumatic.  Mouth/Throat: Oropharynx is clear and moist. No oropharyngeal exudate.  Eyes: Conjunctivae and EOM are normal. Pupils are equal, round, and reactive to light.  Neck: Normal range of motion. Neck supple.  No meningismus.   Cardiovascular: Normal rate, regular rhythm, normal heart sounds and intact distal pulses.   No murmur heard. Pulmonary/Chest: Effort normal and breath sounds normal. No respiratory distress.  Abdominal: Soft. There is no tenderness. There is no rebound and no guarding.  diffuse lower abdominal tenderness, worse suprapubic and LLQ.  Genitourinary:  Genitourinary Comments: Chaperone present  normal external genitalia. White discharge from cervix. Positive CMT. Left adnexal and midline tenderness. Less tenderness on right side.   Musculoskeletal: Normal range of motion. She exhibits no edema or tenderness.  No CVA tenderness  Neurological: She is alert and oriented to person, place, and time. No cranial nerve deficit. She exhibits normal muscle tone. Coordination normal.   5/5 strength throughout. CN 2-12 intact.Equal grip strength.   Skin: Skin is warm.  Psychiatric: She has a normal mood and affect. Her behavior is normal.  Nursing note and vitals reviewed.    ED Treatments / Results  DIAGNOSTIC STUDIES:  Oxygen Saturation is 100% on RA, normal by my interpretation.    COORDINATION OF CARE:  2:17 AM Discussed  treatment plan with pt at bedside and pt agreed to plan.  Labs (all labs ordered are listed, but only abnormal results are displayed) Labs Reviewed  WET PREP, GENITAL - Abnormal; Notable for the following:       Result Value   Clue Cells Wet Prep HPF POC PRESENT (*)    WBC, Wet Prep HPF POC TOO NUMEROUS TO COUNT (*)    All other components within normal limits  CBC - Abnormal; Notable for the following:    WBC 10.8 (*)    All other components within normal limits  LIPASE, BLOOD  COMPREHENSIVE METABOLIC PANEL  URINALYSIS, ROUTINE W REFLEX MICROSCOPIC (NOT AT [facility])  I-STAT BETA HCG BLOOD, ED ([facility], [facility], [facility] ONLY)  POC URINE PREG, ED  GC/CHLAMYDIA PROBE AMP ([place]) NOT AT [facility]    EKG  EKG Interpretation None       Radiology No results found.  Procedures Procedures (including critical care time)  Medications Ordered in ED Medications - No data to display   Initial Impression / Assessment and Plan / ED Course  I have reviewed the triage vital signs and the nursing notes.  Pertinent labs & imaging results that were available during my care of the patient were reviewed by me and considered in my medical decision making (see chart for details).  Clinical Course   Patient with pelvic pain worsening over the past week. Denies urinary or vaginal symptoms. No vomiting.  UA negative. HCG negative. Pelvic with concern for PID.  Will proceed with ultrasound.  Pelvic ultrasound shows no tubo-ovarian abscess or ovarian torsion. Patient treated for PID with Rocephin and Zithromax. Also be treated with Flagyl for bacterial vaginosis.  Patient well-appearing. no vomiting in the ED. Pain is well-controlled. Stable to follow up with her gynecologist. Return precautions discussed.  Final Clinical Impressions(s) / ED Diagnoses   Final diagnoses:  Pelvic pain  PID (acute pelvic inflammatory disease)    New Prescriptions New Prescriptions   No medications on file  I personally  performed the services described in this documentation, which was scribed in my presence. The recorded information has been reviewed and is accurate.    Glynn OctaveStephen Ezinne Yogi, MD 04/15/16 [phone]

## 2016-04-15 NOTE — Discharge Instructions (Signed)
Take the antibiotics as prescribed. Your sexual partner should be treated as well. Followup with your gynecologist. Return to the ED if you develop new or worsening symptoms.

## 2016-04-15 NOTE — ED Notes (Signed)
MD at bedside. 

## 2016-04-15 NOTE — ED Triage Notes (Signed)
Pt presents to the ED with abdominal pain that she describes located in the pelvic region on both sides. Pain started approximately a week ago but is the worst today. Pt has tried to take tylenol but no relief, walking makes the pain worse

## 2016-04-15 NOTE — ED Notes (Signed)
Patient transported to [facility] 

## 2016-04-16 LAB — GC/CHLAMYDIA PROBE AMP (~~LOC~~) NOT AT ARMC
Chlamydia: NEGATIVE
Neisseria Gonorrhea: NEGATIVE

## 2016-04-18 ENCOUNTER — Encounter (HOSPITAL_COMMUNITY): Payer: Self-pay | Admitting: Emergency Medicine

## 2016-04-18 ENCOUNTER — Emergency Department (HOSPITAL_COMMUNITY)
Admission: EM | Admit: 2016-04-18 | Discharge: 2016-04-18 | Disposition: A | Discharge status: Home / Self Care | Payer: Medicaid Other | Attending: Emergency Medicine | Admitting: Emergency Medicine

## 2016-04-18 DIAGNOSIS — E860 Dehydration: Secondary | ICD-10-CM | POA: Insufficient documentation

## 2016-04-18 DIAGNOSIS — N739 Female pelvic inflammatory disease, unspecified: Secondary | ICD-10-CM | POA: Insufficient documentation

## 2016-04-18 DIAGNOSIS — J45909 Unspecified asthma, uncomplicated: Secondary | ICD-10-CM | POA: Insufficient documentation

## 2016-04-18 DIAGNOSIS — F17210 Nicotine dependence, cigarettes, uncomplicated: Secondary | ICD-10-CM | POA: Insufficient documentation

## 2016-04-18 DIAGNOSIS — N730 Acute parametritis and pelvic cellulitis: Secondary | ICD-10-CM

## 2016-04-18 DIAGNOSIS — R197 Diarrhea, unspecified: Secondary | ICD-10-CM

## 2016-04-18 LAB — COMPREHENSIVE METABOLIC PANEL
ALBUMIN: 4.4 g/dL (ref 3.5–5.0)
ALT: 23 U/L (ref 14–54)
AST: 37 U/L (ref 15–41)
Alkaline Phosphatase: 47 U/L (ref 38–126)
Anion gap: 8 (ref 5–15)
BUN: 16 mg/dL (ref 6–20)
CALCIUM: 9 mg/dL (ref 8.9–10.3)
CHLORIDE: 108 mmol/L (ref 101–111)
CO2: 18 mmol/L — AB (ref 22–32)
Creatinine, Ser: 0.71 mg/dL (ref 0.44–1.00)
GFR calc Af Amer: >60 mL/min (ref >60)
GLUCOSE: 106 mg/dL — AB (ref 65–99)
POTASSIUM: 4.9 mmol/L (ref 3.5–5.1)
SODIUM: 134 mmol/L — AB (ref 135–145)
TOTAL PROTEIN: 6.6 g/dL (ref 6.5–8.1)
Total Bilirubin: 1.1 mg/dL (ref 0.3–1.2)

## 2016-04-18 LAB — CBC
HCT: 43.9 % (ref 36.0–46.0)
HEMOGLOBIN: 15.6 g/dL — AB (ref 12.0–15.0)
MCH: 31.1 pg (ref 26.0–34.0)
MCHC: 35.5 g/dL (ref 30.0–36.0)
MCV: 87.6 fL (ref 78.0–100.0)
Platelets: 224 10*3/uL (ref 150–400)
RBC: 5.01 MIL/uL (ref 3.87–5.11)
RDW: 12.8 % (ref 11.5–15.5)
WBC: 9.8 10*3/uL (ref 4.0–10.5)

## 2016-04-18 LAB — I-STAT BETA HCG BLOOD, ED (MC, WL, AP ONLY)

## 2016-04-18 LAB — WET PREP, GENITAL
CLUE CELLS WET PREP: NONE SEEN
Sperm: NONE SEEN
TRICH WET PREP: NONE SEEN
Yeast Wet Prep HPF POC: NONE SEEN

## 2016-04-18 LAB — URINALYSIS, ROUTINE W REFLEX MICROSCOPIC
BILIRUBIN URINE: NEGATIVE
Glucose, UA: NEGATIVE mg/dL
Hgb urine dipstick: NEGATIVE
Ketones, ur: 15 mg/dL — AB
NITRITE: NEGATIVE
Protein, ur: NEGATIVE mg/dL
SPECIFIC GRAVITY, URINE: 1.025 (ref 1.005–1.030)
pH: 5.5 (ref 5.0–8.0)

## 2016-04-18 LAB — URINE MICROSCOPIC-ADD ON: BACTERIA UA: NONE SEEN

## 2016-04-18 LAB — LIPASE, BLOOD: LIPASE: 27 U/L (ref 11–51)

## 2016-04-18 MED ORDER — METOCLOPRAMIDE HCL 5 MG/ML IJ SOLN
10.0000 mg | Freq: Once | INTRAMUSCULAR | Status: AC
  Administered 2016-04-18: 10 mg via INTRAVENOUS
  Filled 2016-04-18: qty 2

## 2016-04-18 MED ORDER — OXYCODONE-ACETAMINOPHEN 5-325 MG PO TABS
2.0000 | ORAL_TABLET | Freq: Once | ORAL | Status: AC
Start: 2016-04-18 — End: 2016-04-18
  Administered 2016-04-18: 2 via ORAL
  Filled 2016-04-18: qty 2

## 2016-04-18 MED ORDER — KETOROLAC TROMETHAMINE 60 MG/2ML IM SOLN
15.0000 mg | Freq: Once | INTRAMUSCULAR | Status: AC
  Administered 2016-04-18: 15 mg via INTRAMUSCULAR
  Filled 2016-04-18: qty 2

## 2016-04-18 MED ORDER — MORPHINE SULFATE (PF) 4 MG/ML IV SOLN
4.0000 mg | Freq: Once | INTRAVENOUS | Status: AC
Start: 2016-04-18 — End: 2016-04-18
  Administered 2016-04-18: 4 mg via INTRAVENOUS
  Filled 2016-04-18: qty 1

## 2016-04-18 MED ORDER — AZITHROMYCIN 250 MG PO TABS: 1000.0000 mg | ORAL_TABLET | Freq: Once | ORAL | 0 refills | Status: AC

## 2016-04-18 MED ORDER — PROMETHAZINE HCL 25 MG PO TABS: 25.0000 mg | ORAL_TABLET | Freq: Four times a day (QID) | ORAL | 0 refills | Status: AC | PRN

## 2016-04-18 MED ORDER — AZITHROMYCIN 250 MG PO TABS
1000.0000 mg | ORAL_TABLET | Freq: Once | ORAL | Status: AC
  Administered 2016-04-18: 1000 mg via ORAL
  Filled 2016-04-18: qty 4

## 2016-04-18 MED ORDER — HYDROCODONE-ACETAMINOPHEN 5-325 MG PO TABS: 1.0000 | ORAL_TABLET | Freq: Four times a day (QID) | ORAL | 0 refills | Status: AC | PRN

## 2016-04-18 MED ORDER — SODIUM CHLORIDE 0.9 % IV BOLUS (SEPSIS)
1000.0000 mL | Freq: Once | INTRAVENOUS | Status: AC
  Administered 2016-04-18: 1000 mL via INTRAVENOUS

## 2016-04-18 NOTE — ED Provider Notes (Signed)
[facility] Provider Note   CSN: [id number] Arrival date & time: 04/18/16  1403     History   Chief Complaint Chief Complaint  Patient presents with  . Allergic Reaction  . Emesis  . Diarrhea  . Pelvic Pain    HPI Nicole Knight is a 25 y.o. female.  HPI 25 year old female with past medical history of hepatitis C who presents with ongoing abdominal pain. The patient was just seen several days ago for lower abdominal pain. She underwent extensive workup at that time, including CT of the abdomen and pelvis as well as ultrasound, which was negative. She states that since then, she has begun to have perfuse, watery diarrhea and nonbloody, nonbilious emesis. She has also noticed persistent lower pelvic pain. Denies any vaginal discharge. Denies any fevers. She has bilateral her spinal pain but denies any CVA tenderness. She has no history of kidney stones. Denies known sick contacts.  Past Medical History:  Diagnosis Date  . Anxiety   . Asthma   . Hepatitis C   . HPV in female   . Psoriasis   . RA (rheumatoid arthritis) [facility](HCC)     Patient Active Problem List   Diagnosis Date Noted  . Psoriasis 03/08/2016  . Low grade squamous intraepithelial lesion (LGSIL) on cervical Pap smear 03/08/2016    Past Surgical History:  Procedure Laterality Date  . HERNIA REPAIR      OB History    Gravida Para Term Preterm AB Living   5       4 1    SAB TAB Ectopic Multiple Live Births   4       1       Home Medications    Prior to Admission medications   Medication Sig Start Date End Date Taking? Authorizing Provider  acetaminophen (TYLENOL) 325 MG tablet Take 650 mg by mouth every 6 (six) hours as needed for mild pain.    Historical Provider, MD  Adalimumab (HUMIRA) 40 MG/0.8ML PSKT Inject 40 mg into the skin every 21 ( twenty-one) days.    Historical Provider, MD  albuterol (PROVENTIL HFA;VENTOLIN HFA) 108 (90 Base) MCG/ACT inhaler Inhale 1-2 puffs into the lungs every  6 (six) hours as needed for wheezing or shortness of breath.    Historical Provider, MD  azithromycin (ZITHROMAX) 250 MG tablet Take 4 tablets (1,000 mg total) by mouth once. Take first 2 tablets together in one week, on 04/25/2016 04/25/16 04/25/16  Shaune Pollackameron Marquelle Balow, MD  doxycycline (VIBRAMYCIN) 100 MG capsule Take 1 capsule (100 mg total) by mouth 2 (two) times daily. 04/15/16   Glynn OctaveStephen Rancour, MD  HYDROcodone-acetaminophen (NORCO/VICODIN) 5-325 MG tablet Take 1-2 tablets by mouth every 6 (six) hours as needed for severe pain. 04/18/16   Shaune Pollackameron Cabria Micalizzi, MD  medroxyPROGESTERone (DEPO-PROVERA) 150 MG/ML injection Inject 150 mg into the muscle every 3 (three) months.    Historical Provider, MD  metroNIDAZOLE (FLAGYL) 500 MG tablet Take 1 tablet (500 mg total) by mouth 3 (three) times daily. 04/15/16   Glynn OctaveStephen Rancour, MD  promethazine (PHENERGAN) 25 MG tablet Take 1 tablet (25 mg total) by mouth every 6 (six) hours as needed for nausea or vomiting. 04/18/16   Shaune Pollackameron Alayiah Fontes, MD    Family History Family History  Problem Relation Age of Onset  . Hypertension Mother   . Hypertension Father   . Diabetes Paternal Grandfather     Social History Social History  Substance Use Topics  . Smoking status: Current Every Day Smoker  Packs/day: 1.00    Years: 10.00    Types: Cigarettes  . Smokeless tobacco: Never Used  . Alcohol use No     Allergies   Ibuprofen and Zofran [ondansetron hcl]   Review of Systems Review of Systems  Constitutional: Negative for chills, fatigue and fever.  HENT: Negative for congestion and rhinorrhea.   Eyes: Negative for visual disturbance.  Respiratory: Negative for cough, shortness of breath and wheezing.   Cardiovascular: Negative for chest pain and leg swelling.  Gastrointestinal: Positive for diarrhea and nausea. Negative for abdominal pain and vomiting.  Genitourinary: Positive for dysuria, pelvic pain and vaginal discharge. Negative for flank pain.    Musculoskeletal: Negative for neck pain and neck stiffness.  Skin: Negative for rash and wound.  Allergic/Immunologic: Negative for immunocompromised state.  Neurological: Negative for syncope, weakness and headaches.  All other systems reviewed and are negative.    Physical Exam Updated Vital Signs BP 107/60 (BP Location: Right Arm)   Pulse 64   Temp 98.1 F (36.7 C) (Oral)   Resp 16   Ht 5' 2.5" (1.588 m)   Wt 131 lb (59.4 kg)   SpO2 100%   BMI 23.58 kg/m   Physical Exam  Constitutional: She is oriented to person, place, and time. She appears well-developed and well-nourished. No distress.  HENT:  Head: Normocephalic and atraumatic.  Mouth/Throat: Oropharynx is clear and moist. No oropharyngeal exudate.  Eyes: Conjunctivae are normal.  Neck: Neck supple.  Cardiovascular: Normal rate, regular rhythm and normal heart sounds.  Exam reveals no friction rub.   No murmur heard. Pulmonary/Chest: Effort normal and breath sounds normal. No respiratory distress. She has no wheezes. She has no rales.  Abdominal: Soft. Bowel sounds are normal. She exhibits no distension. There is tenderness (Mild, suprapubic). There is no rebound and no guarding.  Genitourinary:  Genitourinary Comments: Moderate volume yellow white vaginal discharge. Marked cervical motion tenderness. No adnexal fullness or tenderness.  Musculoskeletal: She exhibits no edema.  Neurological: She is alert and oriented to person, place, and time. She exhibits normal muscle tone.  Skin: Skin is warm. Capillary refill takes less than 2 seconds.  Psychiatric: She has a normal mood and affect.  Nursing note and vitals reviewed.    ED Treatments / Results  Labs (all labs ordered are listed, but only abnormal results are displayed) Labs Reviewed  WET PREP, GENITAL - Abnormal; Notable for the following:       Result Value   WBC, Wet Prep HPF POC MANY (*)    All other components within normal limits  COMPREHENSIVE  METABOLIC PANEL - Abnormal; Notable for the following:    Sodium 134 (*)    CO2 18 (*)    Glucose, Bld 106 (*)    All other components within normal limits  CBC - Abnormal; Notable for the following:    Hemoglobin 15.6 (*)    All other components within normal limits  URINALYSIS, ROUTINE W REFLEX MICROSCOPIC (NOT AT [facility]) - Abnormal; Notable for the following:    Color, Urine AMBER (*)    Ketones, ur 15 (*)    Leukocytes, UA SMALL (*)    All other components within normal limits  URINE MICROSCOPIC-ADD ON - Abnormal; Notable for the following:    Squamous Epithelial / LPF 0-5 (*)    All other components within normal limits  LIPASE, BLOOD  I-STAT BETA HCG BLOOD, ED ([facility], [facility], [facility] ONLY)  GC/CHLAMYDIA PROBE AMP ([place]) NOT AT [facility]  EKG  EKG Interpretation None       Radiology No results found.  Procedures Procedures (including critical care time)  Medications Ordered in ED Medications  sodium chloride 0.9 % bolus 1,000 mL (0 mLs Intravenous Stopped 04/18/16 1800)  morphine 4 MG/ML injection 4 mg (4 mg Intravenous Given 04/18/16 1627)  ketorolac (TORADOL) injection 15 mg (15 mg Intramuscular Given 04/18/16 1627)  metoCLOPramide (REGLAN) injection 10 mg (10 mg Intravenous Given 04/18/16 1628)  azithromycin (ZITHROMAX) tablet 1,000 mg (1,000 mg Oral Given 04/18/16 1800)  oxyCODONE-acetaminophen (PERCOCET/ROXICET) 5-325 MG per tablet 2 tablet (2 tablets Oral Given 04/18/16 1800)     Initial Impression / Assessment and Plan / ED Course  I have reviewed the triage vital signs and the nursing notes.  Pertinent labs & imaging results that were available during my care of the patient were reviewed by me and considered in my medical decision making (see chart for details).  Clinical Course     25 yo F with PMHx as above who p/w persistent pelvic pain, nausea, diarrhea after recent diagnosis of PID. Tolerating PO. On arrival, VSS and WNL. Labs show mild dehydration but o/w  no leukocytosis, no AG or evidence of sepsis or lactic acidosis. Exam is as above. Suspect pt has ongoing PID with subsequent nausea. LFTs are normal without evidence of FHC. Regarding her diarrhea, DDx includes primary GI illness as etiology for her sx versus ABX-induced diarrhea. D/w pharmacy, will switch to azithro 1g PO once weekly x2 doses. Otherwise, IVF given and pt markedly improved. She is tolerating PO. Given recent negative imaging and extensive w/u, do not feel repeat imaging indicated. D/c with return precautions.  Final Clinical Impressions(s) / ED Diagnoses   Final diagnoses:  Dehydration  PID (acute pelvic inflammatory disease)  Diarrhea, unspecified type    New Prescriptions Discharge Medication List as of 04/18/2016  6:15 PM    START taking these medications   Details  azithromycin (ZITHROMAX) 250 MG tablet Take 4 tablets (1,000 mg total) by mouth once. Take first 2 tablets together in one week, on 04/25/2016, Starting Sun 04/25/2016, Print    HYDROcodone-acetaminophen (NORCO/VICODIN) 5-325 MG tablet Take 1-2 tablets by mouth every 6 (six) hours as needed for severe pain., Starting Sun 04/18/2016, Print    promethazine (PHENERGAN) 25 MG tablet Take 1 tablet (25 mg total) by mouth every 6 (six) hours as needed for nausea or vomiting., Starting Sun 04/18/2016, Print         Shaune Pollack, MD 04/19/16 1723

## 2016-04-18 NOTE — ED Triage Notes (Signed)
Pt reports that she may be having an allergic reaction to antibiotics she is currently taking for PID. Pt reports vomiting and diarrhea, pelvic pain and pain to left low back.

## 2016-04-21 LAB — GC/CHLAMYDIA PROBE AMP (~~LOC~~) NOT AT ARMC
Chlamydia: NEGATIVE
NEISSERIA GONORRHEA: NEGATIVE

## 2017-12-30 ENCOUNTER — Telehealth (HOSPITAL_COMMUNITY): Payer: Self-pay

## 2017-12-30 NOTE — Telephone Encounter (Signed)
Tried to call number on file but the person said I had the wrong number.

## 2018-05-22 IMAGING — CR DG CHEST 2V
2 series · 2 of 2 positions shown · non-contrast
Comparison: None.

CLINICAL DATA: Right lower chest pain and shortness of breath
today.

EXAM:
CHEST  2 VIEW

[chest pa]
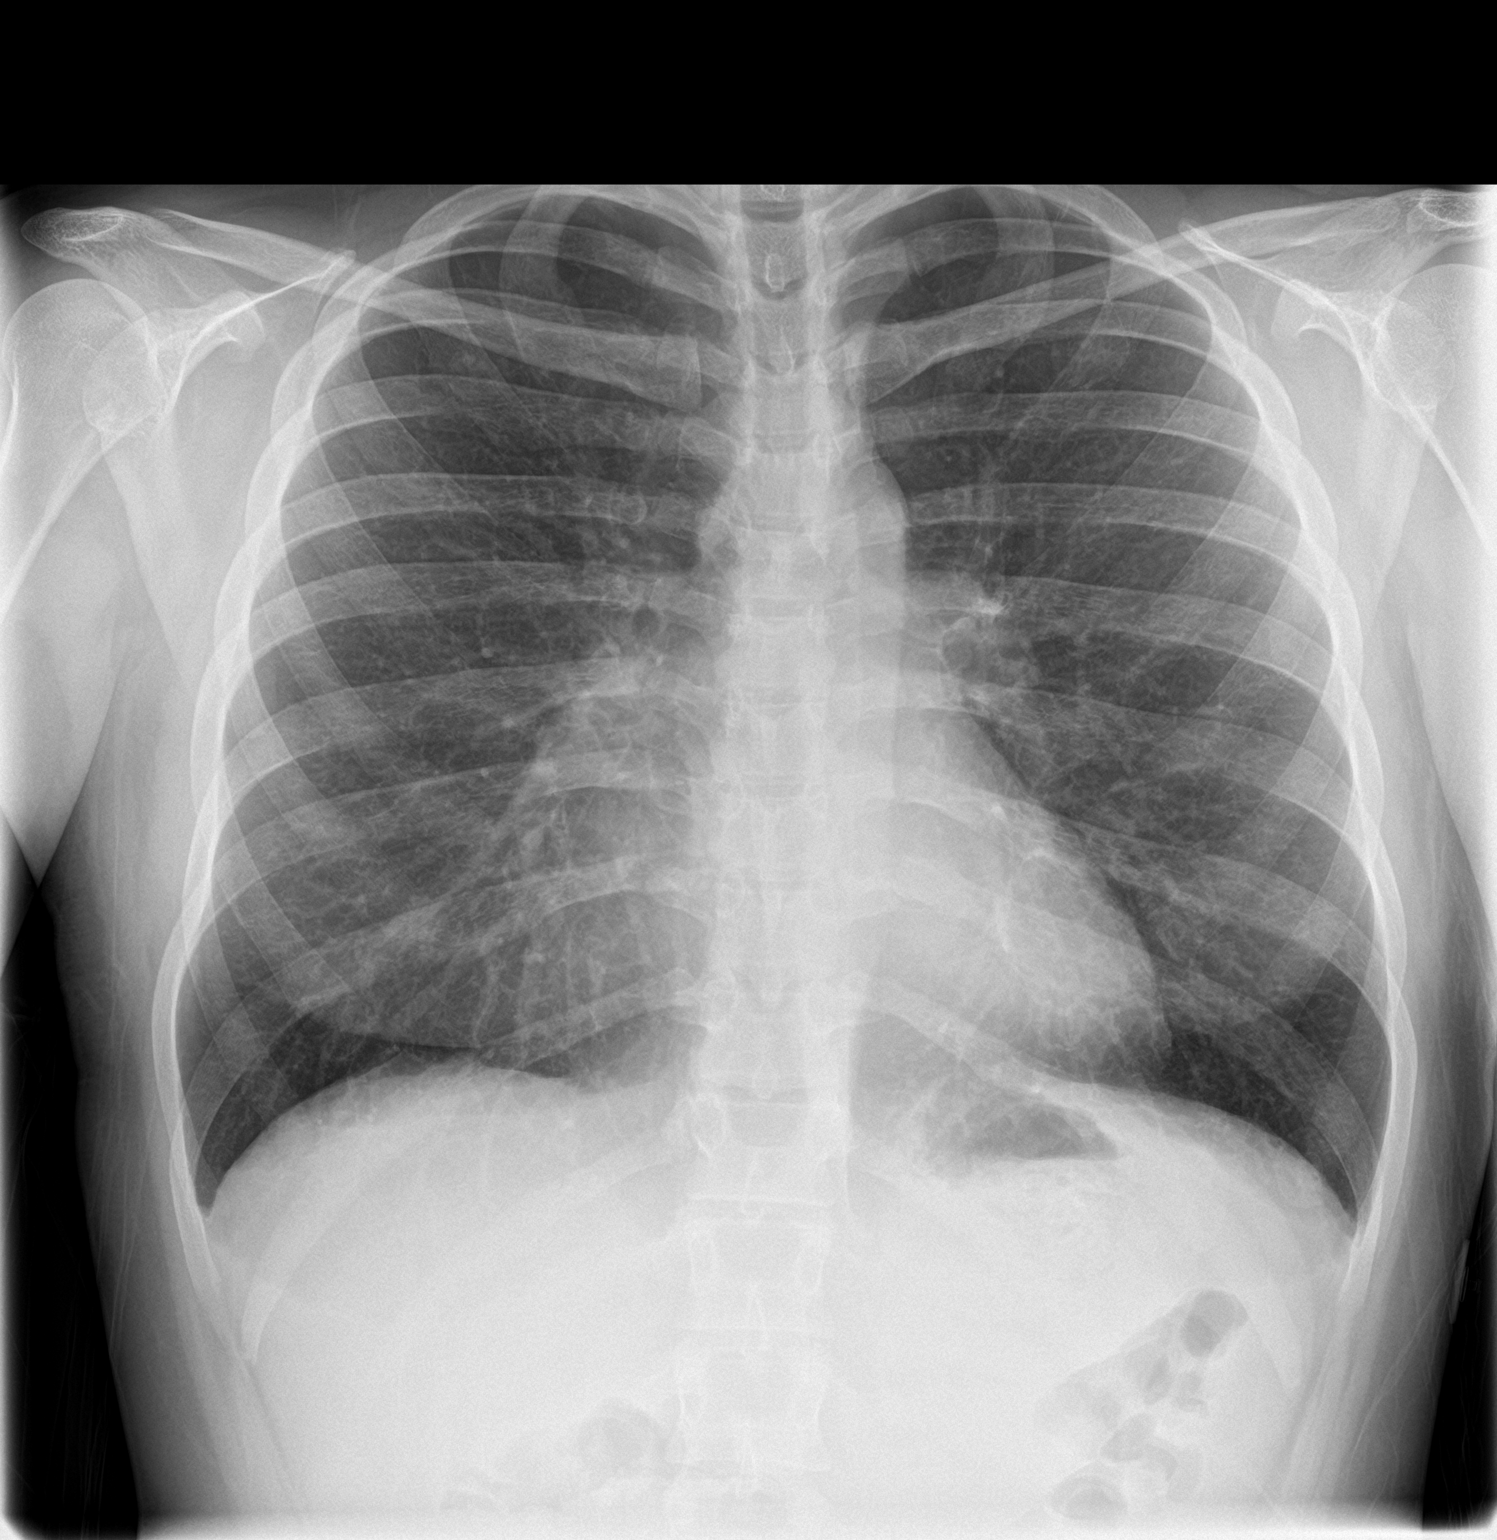

[chest lat]
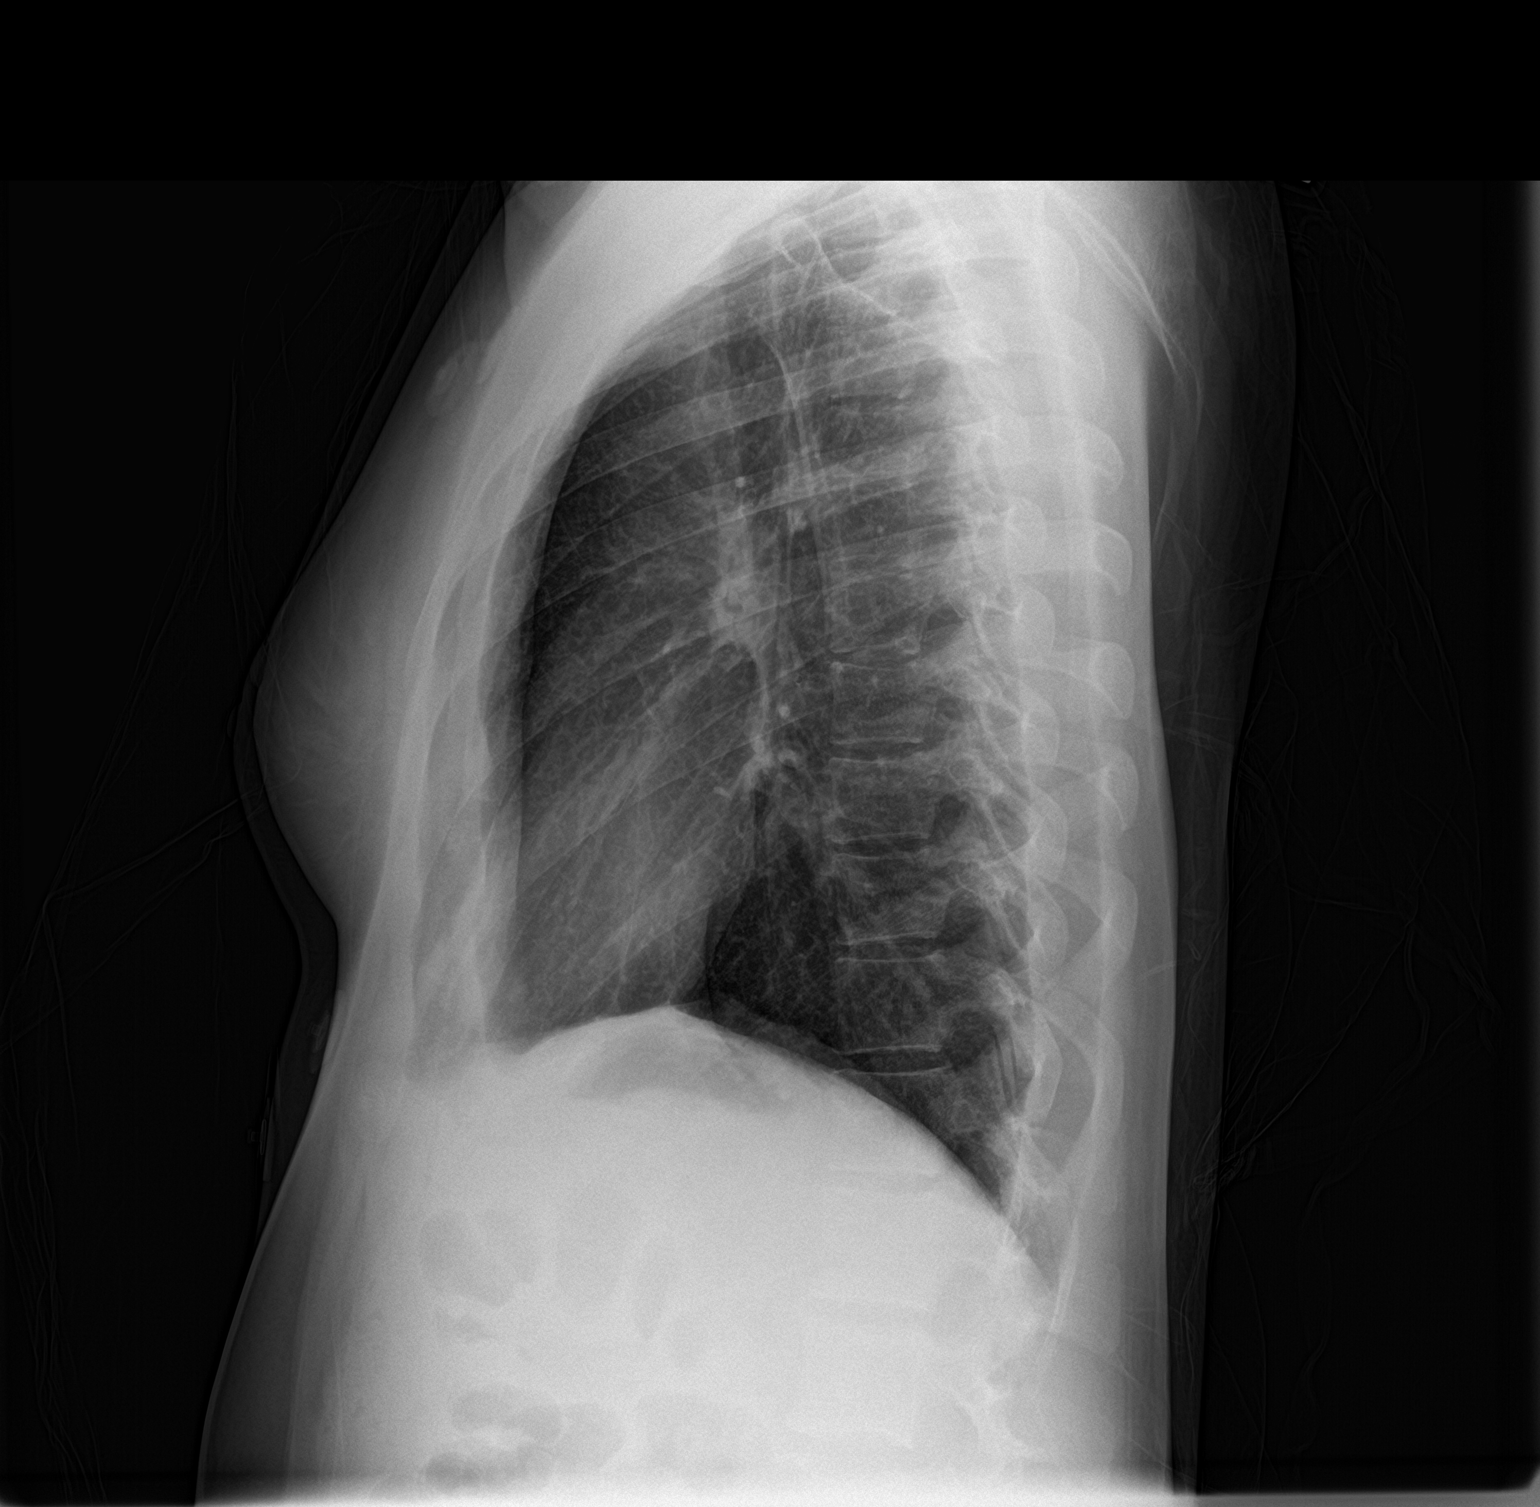

[2 of 2 positions shown; findings below may reference images not displayed]

FINDINGS: The cardiac silhouette, mediastinal and hilar contours are within
normal limits given the pectus deformity. Prominent right infrahilar
vessels is a typical finding. The lungs are clear. No pleural
effusion. The bony thorax is intact.
IMPRESSION: No acute cardiopulmonary findings.

Moderate pectus deformity.

## 2018-06-01 IMAGING — DX DG CHEST 2V
2 series · 2 of 2 positions shown · non-contrast
Comparison: February 09, 2016

CLINICAL DATA: Shortness of breath and cough for 1 week

EXAM:
CHEST  2 VIEW

[chest pa]
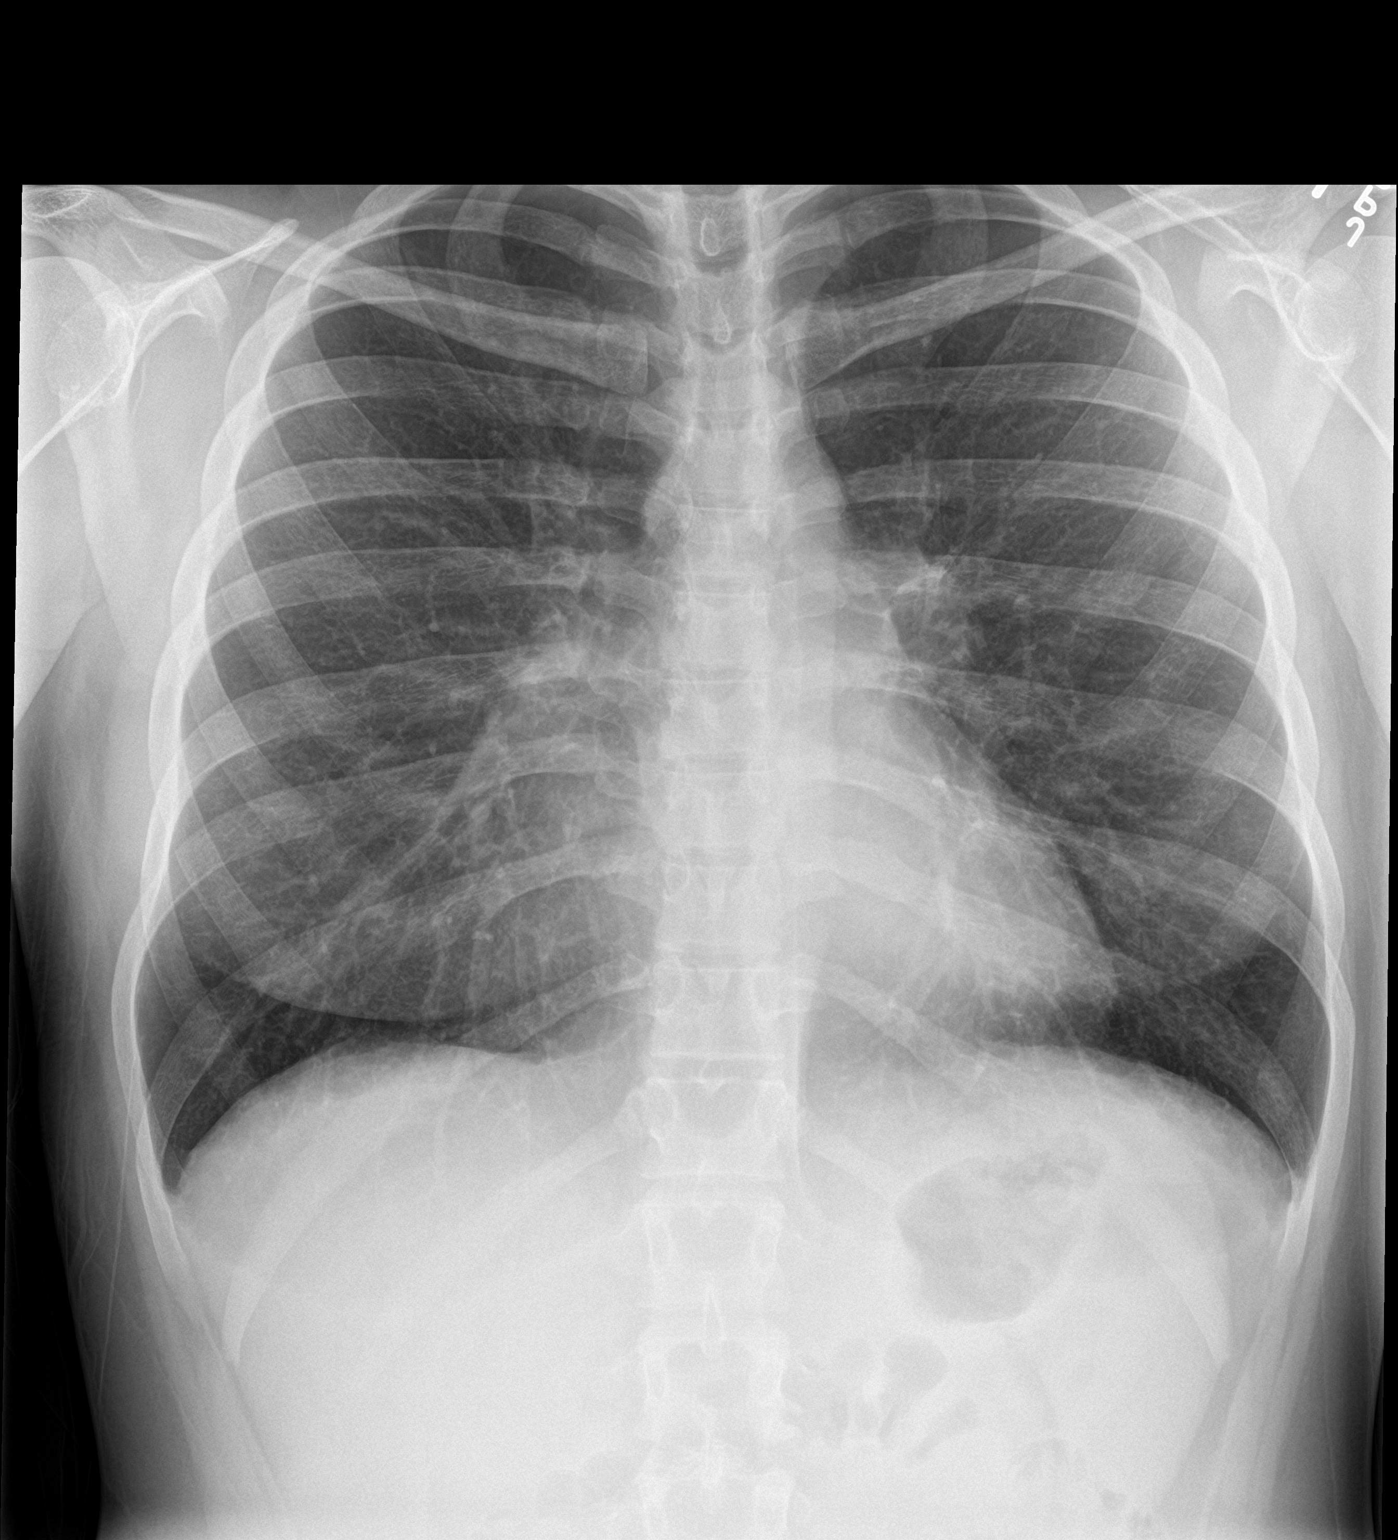

[chest lat]
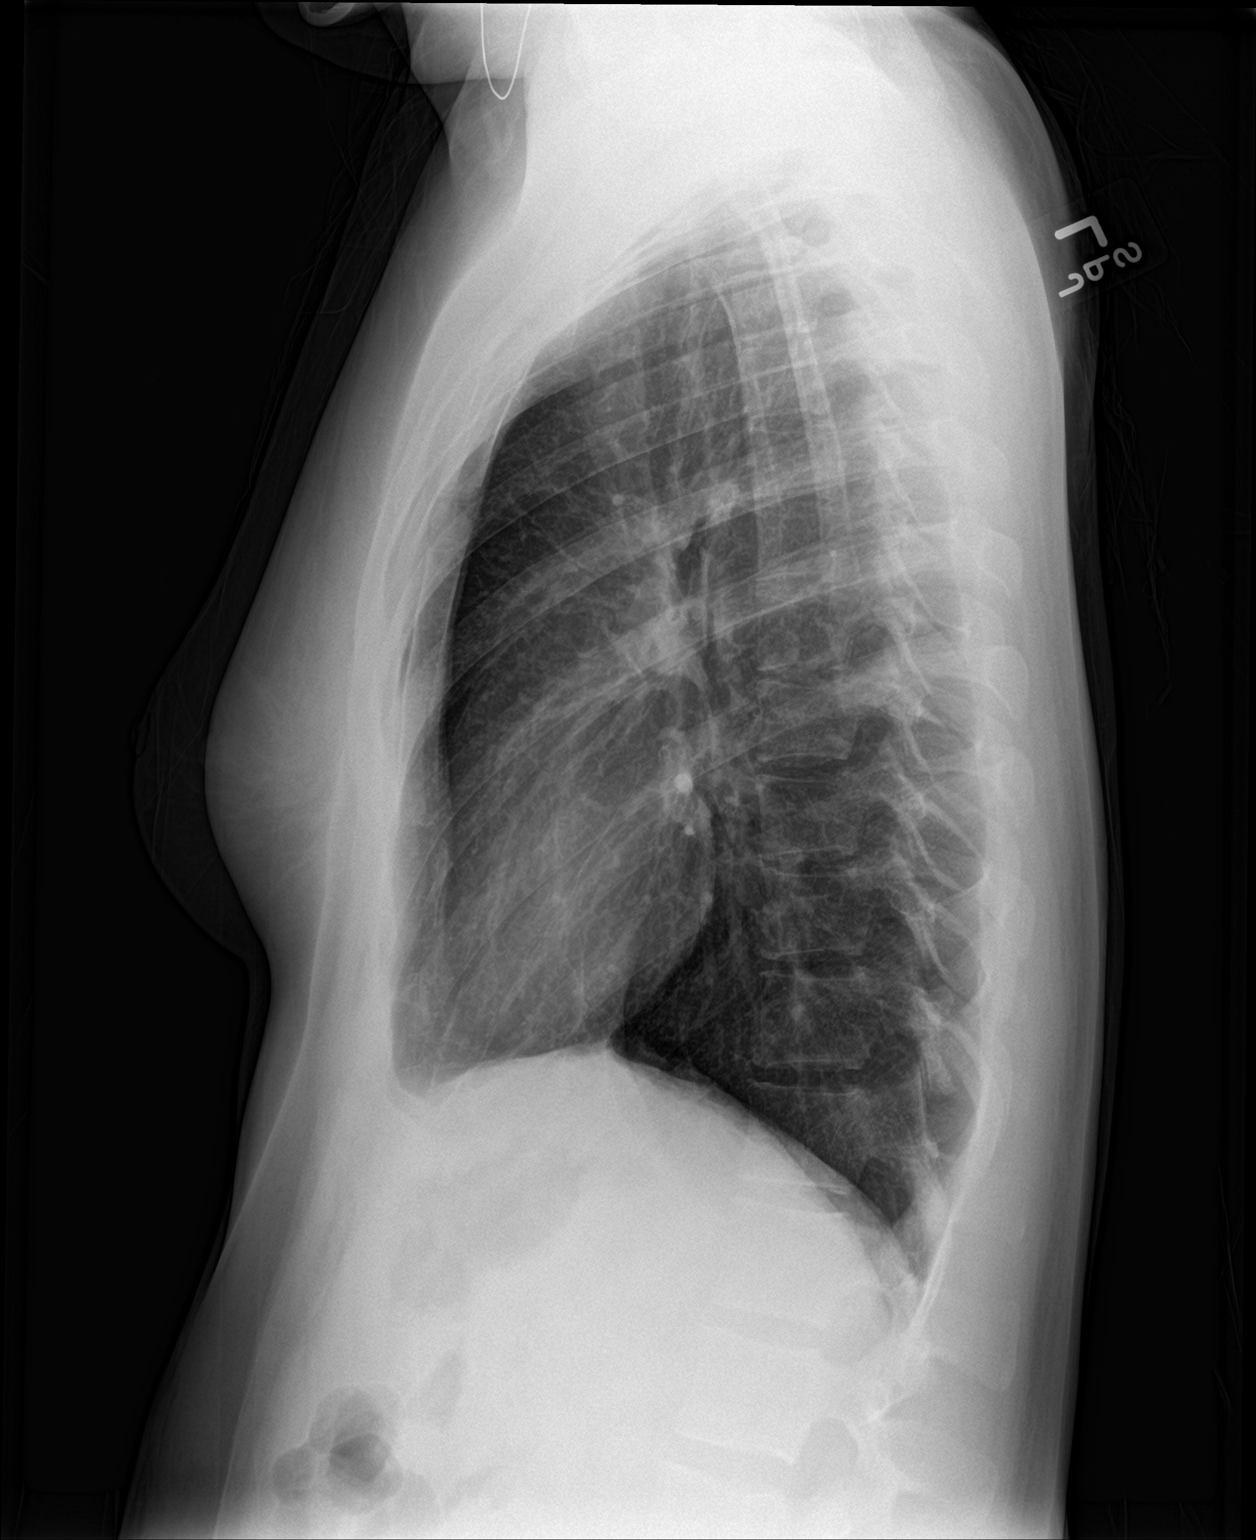

[2 of 2 positions shown; findings below may reference images not displayed]

FINDINGS: There is a degree of pectus excavatum, stable. There is no apparent
edema or consolidation. Heart size and pulmonary vascularity is
within normal limits and stable. No adenopathy. No pneumothorax.
IMPRESSION: No edema or consolidation. Stable pectus deformity. No appreciable
change from recent prior study.
